# Patient Record
Sex: Male | Born: 1985 | Hispanic: Refuse to answer | Marital: Single | Smoking: Current every day smoker
Health system: Southern US, Community
[De-identification: ages and names within clinical notes are randomized; demographics above are authoritative.]

## PROBLEM LIST (undated history)

## (undated) DIAGNOSIS — F32A Depression, unspecified: Secondary | ICD-10-CM

## (undated) DIAGNOSIS — F329 Major depressive disorder, single episode, unspecified: Secondary | ICD-10-CM

## (undated) HISTORY — DX: Major depressive disorder, single episode, unspecified: F32.9

## (undated) HISTORY — PX: CIRCUMCISION: SUR203

## (undated) HISTORY — DX: Depression, unspecified: F32.A

---
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
## 2009-11-23 ENCOUNTER — Emergency Department (HOSPITAL_BASED_OUTPATIENT_CLINIC_OR_DEPARTMENT_OTHER): Admission: EM | Admit: 2009-11-23 | Discharge: 2009-11-23 | Payer: Self-pay | Admitting: Emergency Medicine

## 2010-11-19 ENCOUNTER — Encounter: Payer: Self-pay | Admitting: *Deleted

## 2010-11-19 ENCOUNTER — Emergency Department (HOSPITAL_BASED_OUTPATIENT_CLINIC_OR_DEPARTMENT_OTHER)
Admission: EM | Admit: 2010-11-19 | Discharge: 2010-11-20 | Disposition: A | Discharge status: Home / Self Care | Payer: BC Managed Care – PPO | Attending: Emergency Medicine | Admitting: Emergency Medicine

## 2010-11-19 DIAGNOSIS — K5289 Other specified noninfective gastroenteritis and colitis: Secondary | ICD-10-CM | POA: Insufficient documentation

## 2010-11-19 DIAGNOSIS — R112 Nausea with vomiting, unspecified: Secondary | ICD-10-CM | POA: Insufficient documentation

## 2010-11-19 DIAGNOSIS — R1032 Left lower quadrant pain: Secondary | ICD-10-CM | POA: Insufficient documentation

## 2010-11-19 DIAGNOSIS — R197 Diarrhea, unspecified: Secondary | ICD-10-CM | POA: Insufficient documentation

## 2010-11-19 DIAGNOSIS — K529 Noninfective gastroenteritis and colitis, unspecified: Secondary | ICD-10-CM

## 2010-11-19 MED ORDER — SODIUM CHLORIDE 0.9 % IV BOLUS (SEPSIS): 2000.0000 mL | Freq: Once | INTRAVENOUS | Status: DC

## 2010-11-19 MED ORDER — METOCLOPRAMIDE HCL 5 MG/ML IJ SOLN
10.0000 mg | Freq: Once | INTRAMUSCULAR | Status: AC
  Administered 2010-11-20: 10 mg via INTRAVENOUS
  Filled 2010-11-19: qty 2

## 2010-11-19 NOTE — ED Notes (Signed)
Pt states he has had N/V/D since 0700 today. "Feel weak"  Denies other s/s.

## 2010-11-19 NOTE — ED Provider Notes (Signed)
Scribed for Shawn Seamen, MD, the patient was seen in room MH01/MH01 . This chart was scribed by Ellie Lunch.   CSN: [id number] Arrival date & time: 11/19/2010 11:23 PM   First MD Initiated Contact with Patient 11/19/10 2342      Chief Complaint  Patient presents with  . Abdominal Pain    (Consider location/radiation/quality/duration/timing/severity/associated sxs/prior treatment) Patient is a 25 y.o. male presenting with abdominal pain. The history is provided by the patient. No language interpreter was used.  Abdominal Pain The primary symptoms of the illness include abdominal pain, nausea, vomiting and diarrhea. The primary symptoms of the illness do not include fever. The current episode started 13 to 24 hours ago. The onset of the illness was gradual. The problem has been gradually worsening.  The abdominal pain is located in the LLQ and RLQ. The abdominal pain does not radiate. The severity of the abdominal pain is 8/10. The abdominal pain is relieved by nothing.   Pt seen at 11:43 PM Shawn Meyer is a 25 y.o. male who presents to the Emergency Department complaining of lower abdominal pain. Pain started in am and became progressively worse throughout the day. Pain described as a sharp, cramping pain. Pt reports associated n/v/d and weakness. No fever, HA, dry mouth.    History reviewed. No pertinent past medical history.  Past Surgical History  Procedure Date  . Circumcision     History reviewed. No pertinent family history.  History  Substance Use Topics  . Smoking status: Current Everyday Smoker  . Smokeless tobacco: Not on file  . Alcohol Use: No    Review of Systems  Constitutional: Negative for fever.  Gastrointestinal: Positive for nausea, vomiting, abdominal pain and diarrhea.  Neurological: Positive for weakness.  All other systems reviewed and are negative.   Allergies  Peanut-containing drug products  Home Medications  No current outpatient  prescriptions on file.  BP 125/84  Pulse 84  Temp(Src) 98.3 F (36.8 C) (Oral)  Resp 20  Ht 6\' 4"  (1.93 m)  Wt 175 lb (79.379 kg)  BMI 21.30 kg/m2  SpO2 97%  Physical Exam  Nursing note and vitals reviewed. Constitutional: He is oriented to person, place, and time. He appears well-developed and well-nourished.  HENT:  Head: Normocephalic and atraumatic.  Eyes: EOM are normal. Pupils are equal, round, and reactive to light.  Neck: Normal range of motion.  Cardiovascular: Normal rate, regular rhythm, normal heart sounds and intact distal pulses.   Pulmonary/Chest: Effort normal and breath sounds normal. No respiratory distress.  Abdominal: Soft. He exhibits no distension. There is tenderness.       Mild tenderness in lower abdomen.   Musculoskeletal: Normal range of motion.  Neurological: He is alert and oriented to person, place, and time.  Skin: Skin is warm and dry.  Psychiatric: He has a normal mood and affect. Judgment normal.    ED Course  Procedures (including critical care time) OTHER DATA REVIEWED: Nursing notes, vital signs, and past medical records reviewed.   DIAGNOSTIC STUDIES: Oxygen Saturation is 97% on room air, normal by my interpretation.    11:45 PM EDP at PT bedside. Discussed diagnostic possibilities including viral infections. Plan to treat with fluids.   ED MEDICATIONS Medications  sodium chloride 0.9 % bolus 2,000 mL   metoCLOPramide (REGLAN) injection 10 mg      MDM   Nursing notes and vitals signs, including pulse oximetry, reviewed.  Summary of this visit's results, reviewed by myself:  Labs:  Results for orders placed during the hospital encounter of 11/19/10  URINALYSIS, ROUTINE W REFLEX MICROSCOPIC      Component Value Range   Color, Urine YELLOW  YELLOW    Appearance CLEAR  CLEAR    Specific Gravity, Urine 1.035 (*) 1.005 - 1.030    pH 6.0  5.0 - 8.0    Glucose, UA NEGATIVE  NEGATIVE (mg/dL)   Hgb urine dipstick NEGATIVE   NEGATIVE    Bilirubin Urine NEGATIVE  NEGATIVE    Ketones, ur NEGATIVE  NEGATIVE (mg/dL)   Protein, ur NEGATIVE  NEGATIVE (mg/dL)   Urobilinogen, UA 0.2  0.0 - 1.0 (mg/dL)   Nitrite NEGATIVE  NEGATIVE    Leukocytes, UA NEGATIVE  NEGATIVE       I personally performed the services described in this documentation, which was scribed in my presence.  The recorded information has been reviewed and considered.   2:13 AM Patient feels better after 2 L of normal saline. He is tracking fluids without emesis.     Shawn Seamen, MD 11/20/10 [phone]

## 2010-11-20 LAB — URINALYSIS, ROUTINE W REFLEX MICROSCOPIC
Bilirubin Urine: NEGATIVE
Glucose, UA: NEGATIVE mg/dL
Hgb urine dipstick: NEGATIVE
Ketones, ur: NEGATIVE mg/dL
Leukocytes, UA: NEGATIVE
Nitrite: NEGATIVE
Protein, ur: NEGATIVE mg/dL
Specific Gravity, Urine: 1.035 — ABNORMAL HIGH (ref 1.005–1.030)
Urobilinogen, UA: 0.2 mg/dL (ref 0.0–1.0)
pH: 6 (ref 5.0–8.0)

## 2010-11-20 MED ORDER — DIPHENOXYLATE-ATROPINE 2.5-0.025 MG PO TABS
2.0000 | ORAL_TABLET | Freq: Once | ORAL | Status: AC
  Administered 2010-11-20: 2 via ORAL
  Filled 2010-11-20: qty 2

## 2010-11-20 MED ORDER — DIPHENOXYLATE-ATROPINE 2.5-0.025 MG PO TABS: 2.0000 | ORAL_TABLET | Freq: Four times a day (QID) | ORAL | Status: AC | PRN

## 2010-11-20 MED ORDER — ONDANSETRON HCL 4 MG PO TABS: 4.0000 mg | ORAL_TABLET | Freq: Three times a day (TID) | ORAL | Status: AC | PRN

## 2012-05-27 ENCOUNTER — Emergency Department (HOSPITAL_COMMUNITY)
Admission: EM | Admit: 2012-05-27 | Discharge: 2012-05-27 | Disposition: A | Discharge status: Home / Self Care | Payer: BC Managed Care – PPO | Attending: Emergency Medicine | Admitting: Emergency Medicine

## 2012-05-27 ENCOUNTER — Encounter (HOSPITAL_COMMUNITY): Payer: Self-pay | Admitting: Emergency Medicine

## 2012-05-27 DIAGNOSIS — F17200 Nicotine dependence, unspecified, uncomplicated: Secondary | ICD-10-CM | POA: Insufficient documentation

## 2012-05-27 DIAGNOSIS — H1089 Other conjunctivitis: Secondary | ICD-10-CM | POA: Insufficient documentation

## 2012-05-27 MED ORDER — ERYTHROMYCIN 5 MG/GM OP OINT
TOPICAL_OINTMENT | OPHTHALMIC | Status: AC
  Filled 2012-05-27: qty 3.5

## 2012-05-27 MED ORDER — FLUORESCEIN SODIUM 1 MG OP STRP
ORAL_STRIP | OPHTHALMIC | Status: AC
  Filled 2012-05-27: qty 1

## 2012-05-27 MED ORDER — HYDROCODONE-ACETAMINOPHEN 5-325 MG PO TABS: 1.0000 | ORAL_TABLET | ORAL | Status: DC | PRN

## 2012-05-27 MED ORDER — TETRACAINE HCL 0.5 % OP SOLN
OPHTHALMIC | Status: AC
  Filled 2012-05-27: qty 2

## 2012-05-27 NOTE — ED Notes (Signed)
Bilateral eyes reddened and swollen.  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

## 2012-05-27 NOTE — ED Provider Notes (Addendum)
History     CSN: [id number]  Arrival date & time 05/27/12  0022   First MD Initiated Contact with Patient 05/27/12 0143      Chief Complaint  Patient presents with  . Eye Pain    (Consider location/radiation/quality/duration/timing/severity/associated sxs/prior treatment) HPI HPI Comments: Shawn Meyer is a 27 y.o. 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 He complains of burning and tearing. He is unable to keep his eyes open in the light.   History reviewed. No pertinent past medical history.  Past Surgical History  Procedure Laterality Date  . Circumcision      No family history on file.  History  Substance Use Topics  . Smoking status: Current Every Day Smoker  . Smokeless tobacco: Not on file  . Alcohol Use: No      Review of Systems  Constitutional: Negative for fever.       10 Systems reviewed and are negative for acute change except as noted in the HPI.  HENT: Negative for congestion.        Eye pain  Eyes: Negative for discharge and redness.  Respiratory: Negative for cough and shortness of breath.   Cardiovascular: Negative for chest pain.  Gastrointestinal: Negative for vomiting and abdominal pain.  Musculoskeletal: Negative for back pain.  Skin: Negative for rash.  Neurological: Negative for syncope, numbness and headaches.  Psychiatric/Behavioral:       No behavior change.    Allergies  Peanut-containing drug products  Home Medications   Current Outpatient Rx  Name  Route  Sig  Dispense  Refill  . HYDROcodone-acetaminophen (NORCO/VICODIN) 5-325 MG per tablet   Oral   Take 1 tablet by mouth every 4 (four) hours as needed for pain.   15 tablet   0     BP 154/90  Pulse 75  Temp(Src) 98 F (36.7 C) (Oral)  Resp 20  Ht 6\' 4"  (1.93 m)  Wt 180 lb (81.647 kg)  BMI 21.92 kg/m2  SpO2 98%  Physical Exam  Nursing note and vitals reviewed. Constitutional: He  appears well-developed and well-nourished.  Awake, alert, nontoxic appearance.  HENT:  Head: Normocephalic and atraumatic.  Eyes: EOM are normal. Pupils are equal, round, and reactive to light.  Injected conjunctiva bilaterally, tearing  Vision: R 20/20, L 20/20  Neck: Normal range of motion. Neck supple.  Cardiovascular: Normal rate and intact distal pulses.   Pulmonary/Chest: Effort normal. He exhibits no tenderness.  Abdominal: Soft. Bowel sounds are normal. There is no tenderness. There is no rebound.  Musculoskeletal: Normal range of motion. He exhibits no tenderness.  Baseline ROM, no obvious new focal weakness.  Neurological:  Mental status and motor strength appears baseline for patient and situation.  Skin: Skin is warm. No rash noted.  Psychiatric: He has a normal mood and affect.    ED Course  Procedures (including critical care time) Examined eyes with opthalmoscope, fluorescein negative both eyes. Applied erythromycin ointment to both eyes.    1. 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 No corneal burns or abrasions. Conjunctivitis on exam. Placed erythromycin ointment in both eyes. Follow up with ophthalmologist. Pt stable in ED with no significant deterioration  in condition.The patient appears reasonably screened and/or stabilized for discharge and I doubt any other medical condition or other [facility] requiring further screening, evaluation, or treatment in the ED at this time prior to discharge.  MDM Reviewed: nursing note and vitals           Nicoletta Dress. Colon Branch, MD 05/27/12 1610  Nicoletta Dress. Colon Branch, MD 05/27/12 [phone]

## 2012-05-27 NOTE — ED Notes (Signed)
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

## 2012-05-27 NOTE — ED Notes (Signed)
Discharge instructions given and reviewed with patient.  Prescription given for Vicodin; effects and use explained.  Patient verbalized understanding to use antibiotic ointment 3x/day for the next 3 days, use pain medicine if needed and to follow up with Dr. Lita Mains for continued pain.  Patient ambulatory; discharged home in good condition.

## 2013-10-17 ENCOUNTER — Telehealth: Payer: Self-pay | Admitting: Family Medicine

## 2013-10-17 NOTE — Telephone Encounter (Signed)
appt given per mothers request

## 2013-10-20 ENCOUNTER — Encounter: Payer: Self-pay | Admitting: Nurse Practitioner

## 2013-10-20 ENCOUNTER — Encounter (INDEPENDENT_AMBULATORY_CARE_PROVIDER_SITE_OTHER): Payer: Self-pay

## 2013-10-20 ENCOUNTER — Ambulatory Visit: Payer: Self-pay | Admitting: Nurse Practitioner

## 2013-10-20 VITALS — BP 140/81 | HR 78 | Temp 97.6°F | Ht 76.0 in | Wt 169.8 lb

## 2013-10-20 DIAGNOSIS — F32A Depression, unspecified: Secondary | ICD-10-CM

## 2013-10-20 DIAGNOSIS — F329 Major depressive disorder, single episode, unspecified: Secondary | ICD-10-CM

## 2013-10-20 MED ORDER — CITALOPRAM HYDROBROMIDE 40 MG PO TABS: 40.0000 mg | ORAL_TABLET | Freq: Every day | ORAL | Status: DC

## 2013-10-20 NOTE — Patient Instructions (Signed)
Stress and Stress Management Stress is a normal reaction to life events. It is what you feel when life demands more than you are used to or more than you can handle. Some stress can be useful. For example, the stress reaction can help you catch the last bus of the day, study for a test, or meet a deadline at work. But stress that occurs too often or for too long can cause problems. It can affect your emotional health and interfere with relationships and normal daily activities. Too much stress can weaken your immune system and increase your risk for physical illness. If you already have a medical problem, stress can make it worse. CAUSES  All sorts of life events may cause stress. An event that causes stress for one person may not be stressful for another person. Major life events commonly cause stress. These may be positive or negative. Examples include losing your job, moving into a new home, getting married, having a baby, or losing a loved one. Less obvious life events may also cause stress, especially if they occur day after day or in combination. Examples include working long hours, driving in traffic, caring for children, being in debt, or being in a difficult relationship. SIGNS AND SYMPTOMS Stress may cause emotional symptoms including, the following:  Anxiety. This is feeling worried, afraid, on edge, overwhelmed, or out of control.  Anger. This is feeling irritated or impatient.  Depression. This is feeling sad, down, helpless, or guilty.  Difficulty focusing, remembering, or making decisions. Stress may cause physical symptoms, including the following:   Aches and pains. These may affect your head, neck, back, stomach, or other areas of your body.  Tight muscles or clenched jaw.  Low energy or trouble sleeping. Stress may cause unhealthy behaviors, including the following:   Eating to feel better (overeating) or skipping meals.  Sleeping too little, too much, or both.  Working  too much or putting off tasks (procrastination).  Smoking, drinking alcohol, or using drugs to feel better. DIAGNOSIS  Stress is diagnosed through an assessment by your health care provider. Your health care provider will ask questions about your symptoms and any stressful life events.Your health care provider will also ask about your medical history and may order blood tests or other tests. Certain medical conditions and medicine can cause physical symptoms similar to stress. Mental illness can cause emotional symptoms and unhealthy behaviors similar to stress. Your health care provider may refer you to a mental health professional for further evaluation.  TREATMENT  Stress management is the recommended treatment for stress.The goals of stress management are reducing stressful life events and coping with stress in healthy ways.  Techniques for reducing stressful life events include the following:  Stress identification. Self-monitor for stress and identify what causes stress for you. These skills may help you to avoid some stressful events.  Time management. Set your priorities, keep a calendar of events, and learn to say "no." These tools can help you avoid making too many commitments. Techniques for coping with stress include the following:  Rethinking the problem. Try to think realistically about stressful events rather than ignoring them or overreacting. Try to find the positives in a stressful situation rather than focusing on the negatives.  Exercise. Physical exercise can release both physical and emotional tension. The key is to find a form of exercise you enjoy and do it regularly.  Relaxation techniques. These relax the body and mind. Examples include yoga, meditation, tai chi, biofeedback, deep  breathing, progressive muscle relaxation, listening to music, being out in nature, journaling, and other hobbies. Again, the key is to find one or more that you enjoy and can do  regularly.  Healthy lifestyle. Eat a balanced diet, get plenty of sleep, and do not smoke. Avoid using alcohol or drugs to relax.  Strong support network. Spend time with family, friends, or other people you enjoy being around.Express your feelings and talk things over with someone you trust. Counseling or talktherapy with a mental health professional may be helpful if you are having difficulty managing stress on your own. Medicine is typically not recommended for the treatment of stress.Talk to your health care provider if you think you need medicine for symptoms of stress. HOME CARE INSTRUCTIONS  Keep all follow-up visits as directed by your health care provider.  Take all medicines as directed by your health care provider. SEEK MEDICAL CARE IF:  Your symptoms get worse or you start having new symptoms.  You feel overwhelmed by your problems and can no longer manage them on your own. SEEK IMMEDIATE MEDICAL CARE IF:  You feel like hurting yourself or someone else. Document Released: 06/21/2000 Document Revised: 05/12/2013 Document Reviewed: 08/20/2012 [employer] Patient Information 2015 [employer]. This information is not intended to replace advice given to you by your health care provider. Make sure you discuss any questions you have with your health care provider.  

## 2013-10-20 NOTE — Addendum Note (Signed)
Addended by: Bennie PieriniMARTIN, MARY-MARGARET on: 10/20/2013 03:25 PM   Modules accepted: Level of Service

## 2013-10-20 NOTE — Progress Notes (Signed)
   Subjective:    Patient ID: Shawn Meyer, male    DOB: [birth year], 28 y.o.   MRN: [id number]  HPI Patient in c/o stress- gets shaky and SOB- under a lot of stress- Cries a lot because he is worried about bills.    Review of Systems  Constitutional: Negative.   HENT: Negative.   Respiratory: Negative.   Cardiovascular: Negative.   Genitourinary: Negative.   Neurological: Negative.   All other systems reviewed and are negative.      Objective:   Physical Exam  Constitutional: He is oriented to person, place, and time. He appears well-developed and well-nourished.  Cardiovascular: Normal rate, regular rhythm and normal heart sounds.   Pulmonary/Chest: Effort normal and breath sounds normal.  Neurological: He is alert and oriented to person, place, and time.  Skin: Skin is warm and dry.  Psychiatric: His behavior is normal. Judgment and thought content normal.   BP 140/81  Pulse 78  Temp(Src) 97.6 F (36.4 C) (Oral)  Ht 6\' 4"  (1.93 m)  Wt 169 lb 12.8 oz (77.021 kg)  BMI 20.68 kg/m2  Says that he thinks he would be better off dead but has no intensions of killing himself.       Assessment & Plan:   1. Depression    Meds ordered this encounter  Medications  . citalopram (CELEXA) 40 MG tablet    Sig: Take 1 tablet (40 mg total) by mouth daily.    Dispense:  30 tablet    Refill:  3    Order Specific Question:  Supervising Provider    Answer:  Deborra MedinaMOORE, DONALD W [[id number]]   Stress management Needs to get out of house and do things RTO in 3 weeks  Mary-Margaret Daphine DeutscherMartin, FNP

## 2013-11-20 ENCOUNTER — Ambulatory Visit: Payer: Self-pay | Admitting: Nurse Practitioner

## 2015-07-02 ENCOUNTER — Emergency Department (HOSPITAL_COMMUNITY): Payer: No Typology Code available for payment source

## 2015-07-02 ENCOUNTER — Emergency Department (HOSPITAL_COMMUNITY)
Admission: EM | Admit: 2015-07-02 | Discharge: 2015-07-02 | Disposition: A | Discharge status: Home / Self Care | Payer: No Typology Code available for payment source | Attending: Emergency Medicine | Admitting: Emergency Medicine

## 2015-07-02 ENCOUNTER — Encounter (HOSPITAL_COMMUNITY): Payer: Self-pay | Admitting: Emergency Medicine

## 2015-07-02 DIAGNOSIS — S0990XA Unspecified injury of head, initial encounter: Secondary | ICD-10-CM | POA: Insufficient documentation

## 2015-07-02 DIAGNOSIS — M25571 Pain in right ankle and joints of right foot: Secondary | ICD-10-CM | POA: Diagnosis not present

## 2015-07-02 DIAGNOSIS — S301XXA Contusion of abdominal wall, initial encounter: Secondary | ICD-10-CM | POA: Diagnosis not present

## 2015-07-02 DIAGNOSIS — M25561 Pain in right knee: Secondary | ICD-10-CM | POA: Diagnosis not present

## 2015-07-02 DIAGNOSIS — Y999 Unspecified external cause status: Secondary | ICD-10-CM | POA: Diagnosis not present

## 2015-07-02 DIAGNOSIS — S70211A Abrasion, right hip, initial encounter: Secondary | ICD-10-CM | POA: Insufficient documentation

## 2015-07-02 DIAGNOSIS — Y9389 Activity, other specified: Secondary | ICD-10-CM | POA: Insufficient documentation

## 2015-07-02 DIAGNOSIS — Z791 Long term (current) use of non-steroidal anti-inflammatories (NSAID): Secondary | ICD-10-CM | POA: Insufficient documentation

## 2015-07-02 DIAGNOSIS — M25562 Pain in left knee: Secondary | ICD-10-CM | POA: Diagnosis not present

## 2015-07-02 DIAGNOSIS — S20212A Contusion of left front wall of thorax, initial encounter: Secondary | ICD-10-CM | POA: Insufficient documentation

## 2015-07-02 DIAGNOSIS — F17200 Nicotine dependence, unspecified, uncomplicated: Secondary | ICD-10-CM | POA: Insufficient documentation

## 2015-07-02 DIAGNOSIS — Y92410 Unspecified street and highway as the place of occurrence of the external cause: Secondary | ICD-10-CM | POA: Insufficient documentation

## 2015-07-02 DIAGNOSIS — S299XXA Unspecified injury of thorax, initial encounter: Secondary | ICD-10-CM | POA: Diagnosis present

## 2015-07-02 LAB — I-STAT CHEM 8, ED
BUN: 15 mg/dL (ref 6–20)
CREATININE: 0.9 mg/dL (ref 0.61–1.24)
Calcium, Ion: 1.21 mmol/L (ref 1.12–1.23)
Chloride: 102 mmol/L (ref 101–111)
Glucose, Bld: 98 mg/dL (ref 65–99)
HEMATOCRIT: 48 % (ref 39.0–52.0)
HEMOGLOBIN: 16.3 g/dL (ref 13.0–17.0)
POTASSIUM: 3.6 mmol/L (ref 3.5–5.1)
SODIUM: 140 mmol/L (ref 135–145)
TCO2: 26 mmol/L (ref 0–100)

## 2015-07-02 MED ORDER — NAPROXEN 250 MG PO TABS: 250.0000 mg | ORAL_TABLET | Freq: Two times a day (BID) | ORAL | Status: DC | PRN

## 2015-07-02 MED ORDER — HYDROCODONE-ACETAMINOPHEN 5-325 MG PO TABS: ORAL_TABLET | ORAL | Status: DC

## 2015-07-02 MED ORDER — MORPHINE SULFATE (PF) 4 MG/ML IV SOLN
4.0000 mg | INTRAVENOUS | Status: DC | PRN
  Administered 2015-07-02: 4 mg via INTRAVENOUS
  Filled 2015-07-02: qty 1

## 2015-07-02 MED ORDER — METHOCARBAMOL 500 MG PO TABS: 1000.0000 mg | ORAL_TABLET | Freq: Four times a day (QID) | ORAL | Status: DC | PRN

## 2015-07-02 MED ORDER — IOPAMIDOL (ISOVUE-300) INJECTION 61%
100.0000 mL | Freq: Once | INTRAVENOUS | Status: AC | PRN
  Administered 2015-07-02: 100 mL via INTRAVENOUS
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
## 2015-07-02 NOTE — Discharge Instructions (Signed)
Take the prescriptions as directed.  Apply moist heat or ice to the area(s) of discomfort, for 15 minutes at a time, several times per day for the next few days.  Do not fall asleep on a heating or ice pack.  Call your regular medical doctor today to schedule a follow up appointment in the next 3 days.  Return to the Emergency Department immediately if worsening. ° °
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
## 2015-07-02 NOTE — ED Notes (Signed)
Pt verbalized understanding of no driving and to use caution within 4 hours of taking pain meds due to meds cause drowsiness 

## 2016-01-25 ENCOUNTER — Ambulatory Visit (INDEPENDENT_AMBULATORY_CARE_PROVIDER_SITE_OTHER): Payer: Medicaid Other | Admitting: Physician Assistant

## 2016-01-25 ENCOUNTER — Encounter: Payer: Self-pay | Admitting: Physician Assistant

## 2016-01-25 VITALS — BP 130/88 | HR 69 | Temp 96.7°F | Ht 78.0 in | Wt 188.6 lb

## 2016-01-25 DIAGNOSIS — F3342 Major depressive disorder, recurrent, in full remission: Secondary | ICD-10-CM

## 2016-01-25 DIAGNOSIS — M545 Low back pain, unspecified: Secondary | ICD-10-CM

## 2016-01-25 DIAGNOSIS — J069 Acute upper respiratory infection, unspecified: Secondary | ICD-10-CM | POA: Diagnosis not present

## 2016-01-25 DIAGNOSIS — G8929 Other chronic pain: Secondary | ICD-10-CM | POA: Insufficient documentation

## 2016-01-25 MED ORDER — CITALOPRAM HYDROBROMIDE 40 MG PO TABS: 40.0000 mg | ORAL_TABLET | Freq: Every day | ORAL | 6 refills | Status: DC

## 2016-01-25 MED ORDER — METHOCARBAMOL 500 MG PO TABS: 1000.0000 mg | ORAL_TABLET | Freq: Four times a day (QID) | ORAL | 2 refills | Status: DC | PRN

## 2016-01-25 MED ORDER — AZITHROMYCIN 250 MG PO TABS: ORAL_TABLET | ORAL | 0 refills | Status: DC

## 2016-01-25 NOTE — Patient Instructions (Signed)
Back Exercises Introduction If you have pain in your back, do these exercises 2-3 times each day or as told by your doctor. When the pain goes away, do the exercises once each day, but repeat the steps more times for each exercise (do more repetitions). If you do not have pain in your back, do these exercises once each day or as told by your doctor. Exercises Single Knee to Chest  Do these steps 3-5 times in a row for each leg: 1. Lie on your back on a firm bed or the floor with your legs stretched out. 2. Bring one knee to your chest. 3. Hold your knee to your chest by grabbing your knee or thigh. 4. Pull on your knee until you feel a gentle stretch in your lower back. 5. Keep doing the stretch for 10-30 seconds. 6. Slowly let go of your leg and straighten it. Pelvic Tilt  Do these steps 5-10 times in a row: 1. Lie on your back on a firm bed or the floor with your legs stretched out. 2. Bend your knees so they point up to the ceiling. Your feet should be flat on the floor. 3. Tighten your lower belly (abdomen) muscles to press your lower back against the floor. This will make your tailbone point up to the ceiling instead of pointing down to your feet or the floor. 4. Stay in this position for 5-10 seconds while you gently tighten your muscles and breathe evenly. Cat-Cow  Do these steps until your lower back bends more easily: 1. Get on your hands and knees on a firm surface. Keep your hands under your shoulders, and keep your knees under your hips. You may put padding under your knees. 2. Let your head hang down, and make your tailbone point down to the floor so your lower back is round like the back of a cat. 3. Stay in this position for 5 seconds. 4. Slowly lift your head and make your tailbone point up to the ceiling so your back hangs low (sags) like the back of a cow. 5. Stay in this position for 5 seconds. Press-Ups  Do these steps 5-10 times in a row: 1. Lie on your belly  (face-down) on the floor. 2. Place your hands near your head, about shoulder-width apart. 3. While you keep your back relaxed and keep your hips on the floor, slowly straighten your arms to raise the top half of your body and lift your shoulders. Do not use your back muscles. To make yourself more comfortable, you may change where you place your hands. 4. Stay in this position for 5 seconds. 5. Slowly return to lying flat on the floor. Bridges  Do these steps 10 times in a row: 1. Lie on your back on a firm surface. 2. Bend your knees so they point up to the ceiling. Your feet should be flat on the floor. 3. Tighten your butt muscles and lift your butt off of the floor until your waist is almost as high as your knees. If you do not feel the muscles working in your butt and the back of your thighs, slide your feet 1-2 inches farther away from your butt. 4. Stay in this position for 3-5 seconds. 5. Slowly lower your butt to the floor, and let your butt muscles relax. If this exercise is too easy, try doing it with your arms crossed over your chest. Belly Crunches  Do these steps 5-10 times in a row: 1. Lie   on your back on a firm bed or the floor with your legs stretched out. 2. Bend your knees so they point up to the ceiling. Your feet should be flat on the floor. 3. Cross your arms over your chest. 4. Tip your chin a little bit toward your chest but do not bend your neck. 5. Tighten your belly muscles and slowly raise your chest just enough to lift your shoulder blades a tiny bit off of the floor. 6. Slowly lower your chest and your head to the floor. Back Lifts  Do these steps 5-10 times in a row: 1. Lie on your belly (face-down) with your arms at your sides, and rest your forehead on the floor. 2. Tighten the muscles in your legs and your butt. 3. Slowly lift your chest off of the floor while you keep your hips on the floor. Keep the back of your head in line with the curve in your back.  Look at the floor while you do this. 4. Stay in this position for 3-5 seconds. 5. Slowly lower your chest and your face to the floor. Contact a doctor if:  Your back pain gets a lot worse when you do an exercise.  Your back pain does not lessen 2 hours after you exercise. If you have any of these problems, stop doing the exercises. Do not do them again unless your doctor says it is okay. Get help right away if:  You have sudden, very bad back pain. If this happens, stop doing the exercises. Do not do them again unless your doctor says it is okay. This information is not intended to replace advice given to you by your health care provider. Make sure you discuss any questions you have with your health care provider. Document Released: 01/28/2010 Document Revised: 06/03/2015 Document Reviewed: 02/19/2014  2017 [employer]  

## 2016-01-27 NOTE — Progress Notes (Signed)
BP 130/88   Pulse 69   Temp (!) 96.7 F (35.9 C) (Oral)   Ht 6\' 6"  (1.981 m)   Wt 188 lb 9.6 oz (85.5 kg)   BMI 21.79 kg/m    Subjective:    Patient ID: Shawn Meyer, male    DOB: 02/13/1985, 31 y.o.   MRN: [id number]  HPI: Shawn Meyer is a 31 y.o. male presenting on 01/25/2016 for Sinusitis and Medication Refill (Patient is requesting a refill on citalopram )  Patient has been sick for several days. Denies any severe fever. Has had significant cough congestion. He also needs refill on medications as noted. He has not had any other significant setback.  Relevant past medical, surgical, family and social history reviewed and updated as indicated. Allergies and medications reviewed and updated.  Past Medical History:  Diagnosis Date  . Depression     Past Surgical History:  Procedure Laterality Date  . CIRCUMCISION      Review of Systems  Constitutional: Positive for fatigue. Negative for appetite change.  HENT: Positive for sinus pressure and sore throat.   Eyes: Negative.  Negative for pain and visual disturbance.  Respiratory: Positive for shortness of breath and wheezing. Negative for cough and chest tightness.   Cardiovascular: Negative.  Negative for chest pain, palpitations and leg swelling.  Gastrointestinal: Negative.  Negative for abdominal pain, diarrhea, nausea and vomiting.  Endocrine: Negative.   Genitourinary: Negative.   Musculoskeletal: Positive for back pain and myalgias.  Skin: Negative.  Negative for color change and rash.  Neurological: Positive for headaches. Negative for weakness and numbness.  Psychiatric/Behavioral: Negative.     Allergies as of 01/25/2016      Reactions   Peanut-containing Drug Products Anaphylaxis      Medication List       Accurate as of 01/25/16 11:59 PM. Always use your most recent med list.          azithromycin 250 MG tablet Commonly known as:  ZITHROMAX Z-PAK Take as directed   citalopram 40 MG  tablet Commonly known as:  CELEXA Take 1 tablet (40 mg total) by mouth daily.   methocarbamol 500 MG tablet Commonly known as:  ROBAXIN Take 2 tablets (1,000 mg total) by mouth 4 (four) times daily as needed for muscle spasms (muscle spasm/pain).          Objective:    BP 130/88   Pulse 69   Temp (!) 96.7 F (35.9 C) (Oral)   Ht 6\' 6"  (1.981 m)   Wt 188 lb 9.6 oz (85.5 kg)   BMI 21.79 kg/m   Allergies  Allergen Reactions  . Peanut-Containing Drug Products Anaphylaxis    Physical Exam  Constitutional: He is oriented to person, place, and time. He appears well-developed and well-nourished. He appears distressed.  HENT:  Head: Normocephalic and atraumatic.  Right Ear: Tympanic membrane normal. No drainage. No middle ear effusion.  Left Ear: Tympanic membrane normal. No drainage.  No middle ear effusion.  Nose: Mucosal edema and rhinorrhea present. Right sinus exhibits no maxillary sinus tenderness. Left sinus exhibits no maxillary sinus tenderness.  Mouth/Throat: Uvula is midline. Posterior oropharyngeal erythema present. No oropharyngeal exudate.  Eyes: Conjunctivae and EOM are normal. Pupils are equal, round, and reactive to light. Right eye exhibits no discharge. Left eye exhibits no discharge.  Neck: Normal range of motion.  Cardiovascular: Normal rate, regular rhythm and normal heart sounds.   Pulmonary/Chest: Effort normal and breath sounds normal. No respiratory  distress. He has no wheezes.  Abdominal: Soft.  Lymphadenopathy:    He has no cervical adenopathy.  Neurological: He is alert and oriented to person, place, and time.  Skin: Skin is warm and dry.  Psychiatric: He has a normal mood and affect. His behavior is normal.  Nursing note and vitals reviewed.   Results for orders placed or performed during the hospital encounter of 07/02/15  I-stat Chem 8, ED  Result Value Ref Range   Sodium 140 135 - 145 mmol/L   Potassium 3.6 3.5 - 5.1 mmol/L   Chloride 102  101 - 111 mmol/L   BUN 15 6 - 20 mg/dL   Creatinine, Ser 1.610.90 0.61 - 1.24 mg/dL   Glucose, Bld 98 65 - 99 mg/dL   Calcium, Ion 0.961.21 0.451.12 - 1.23 mmol/L   TCO2 26 0 - 100 mmol/L   Hemoglobin 16.3 13.0 - 17.0 g/dL   HCT 40.948.0 81.139.0 - 91.452.0 %      Assessment & Plan:   1. Acute upper respiratory infection - azithromycin (ZITHROMAX Z-PAK) 250 MG tablet; Take as directed  Dispense: 6 each; Refill: 0  2. Recurrent major depressive disorder, in full remission (HCC) - citalopram (CELEXA) 40 MG tablet; Take 1 tablet (40 mg total) by mouth daily.  Dispense: 30 tablet; Refill: 6  3. Chronic midline low back pain without sciatica - methocarbamol (ROBAXIN) 500 MG tablet; Take 2 tablets (1,000 mg total) by mouth 4 (four) times daily as needed for muscle spasms (muscle spasm/pain).  Dispense: 30 tablet; Refill: 2   Continue all other maintenance medications as listed above.  Follow up plan: Return if symptoms worsen or fail to improve.  No orders of the defined types were placed in this encounter.   Educational handout given for back exercsies  Remus LofflerAngel S. Rohan Juenger PA-C [facility] [address]  [place], [place] [address] [phone]   01/27/2016, 7:18 PM

## 2016-03-10 ENCOUNTER — Ambulatory Visit: Payer: Medicaid Other | Admitting: Family Medicine

## 2016-03-13 ENCOUNTER — Encounter: Payer: Self-pay | Admitting: Nurse Practitioner

## 2016-03-13 ENCOUNTER — Telehealth: Payer: Self-pay | Admitting: Nurse Practitioner

## 2016-05-21 ENCOUNTER — Other Ambulatory Visit: Payer: Self-pay | Admitting: Physician Assistant

## 2016-05-21 DIAGNOSIS — G8929 Other chronic pain: Secondary | ICD-10-CM

## 2016-05-21 DIAGNOSIS — M545 Low back pain: Principal | ICD-10-CM
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
## 2021-11-11 ENCOUNTER — Encounter: Payer: Medicaid Other | Admitting: Nurse Practitioner

## 2021-11-11 NOTE — Progress Notes (Unsigned)
   Subjective:    Patient ID: Shawn Meyer, male    DOB: 08/15/1985, 36 y.o.   MRN: [id number]  HPI    Review of Systems     Objective:   Physical Exam        Assessment & Plan:

## 2021-12-12 ENCOUNTER — Ambulatory Visit: Payer: Medicaid Other | Admitting: Nurse Practitioner

## 2021-12-12 ENCOUNTER — Encounter: Payer: Self-pay | Admitting: Nurse Practitioner

## 2021-12-12 VITALS — BP 139/82 | HR 80 | Temp 97.8°F | Resp 20 | Ht 78.0 in | Wt 190.0 lb

## 2021-12-12 DIAGNOSIS — F3342 Major depressive disorder, recurrent, in full remission: Secondary | ICD-10-CM | POA: Diagnosis not present

## 2021-12-12 MED ORDER — BUSPIRONE HCL 10 MG PO TABS: 10.0000 mg | ORAL_TABLET | Freq: Two times a day (BID) | ORAL | 0 refills | Status: DC | PRN

## 2021-12-12 MED ORDER — ESCITALOPRAM OXALATE 10 MG PO TABS: 10.0000 mg | ORAL_TABLET | Freq: Every day | ORAL | 5 refills | Status: DC

## 2021-12-12 NOTE — Patient Instructions (Signed)

## 2021-12-12 NOTE — Progress Notes (Signed)
Subjective:    Patient ID: Shawn Meyer, male    DOB: [birth year], 36 y.o.   MRN: [id number]   Chief Complaint: Establish Care   HPI Patient comes in today to establish care. He has had a long bout with depression and anxiety. Was on ciltalopram but has not had it in several weeks. He was changed to just buspar and that does not help as well. He feels very stressed.      12/12/2021    3:57 PM 01/25/2016   10:13 AM 10/20/2013    3:06 PM  Depression screen PHQ 2/9  Decreased Interest 2 1 1   Down, Depressed, Hopeless 2 1 3   PHQ - 2 Score 4 2 4   Altered sleeping 3 3 3   Tired, decreased energy 2 1 2   Change in appetite 0 2 3  Feeling bad or failure about yourself  0 1 3  Trouble concentrating 0 0 3  Moving slowly or fidgety/restless 2 0 1  Suicidal thoughts 0 0 1  PHQ-9 Score 11 9 20   Difficult doing work/chores Very difficult        12/12/2021    3:58 PM  GAD 7 : Generalized Anxiety Score  Nervous, Anxious, on Edge 3  Control/stop worrying 2  Worry too much - different things 2  Trouble relaxing 3  Restless 0  Easily annoyed or irritable 2  Afraid - awful might happen 0  Total GAD 7 Score 12  Anxiety Difficulty Very difficult      Review of Systems  Constitutional:  Negative for diaphoresis.  Eyes:  Negative for pain.  Respiratory:  Negative for shortness of breath.   Cardiovascular:  Negative for chest pain, palpitations and leg swelling.  Gastrointestinal:  Negative for abdominal pain.  Endocrine: Negative for polydipsia.  Skin:  Negative for rash.  Neurological:  Negative for dizziness, weakness and headaches.  Hematological:  Does not bruise/bleed easily.  All other systems reviewed and are negative.      Objective:   Physical Exam Constitutional:      Appearance: Normal appearance.  Cardiovascular:     Rate and Rhythm: Normal rate and regular rhythm.     Heart sounds: Normal heart sounds.  Pulmonary:     Effort: Pulmonary effort is normal.      Breath sounds: Normal breath sounds.  Skin:    General: Skin is warm.  Neurological:     General: No focal deficit present.     Mental Status: He is alert and oriented to person, place, and time.  Psychiatric:        Mood and Affect: Mood normal.        Behavior: Behavior normal.     BP 139/82   Pulse 80   Temp 97.8 F (36.6 C) (Temporal)   Resp 20   Ht 6\' 6"  (1.981 m)   Wt 190 lb (86.2 kg)   SpO2 95%   BMI 21.96 kg/m        Assessment & Plan:  Shawn Meyer in today with chief complaint of Establish Care   1. Recurrent major depressive disorder, in full remission ([place]) Stress management Gen sight swab done - escitalopram (LEXAPRO) 10 MG tablet; Take 1 tablet (10 mg total) by mouth daily.  Dispense: 30 tablet; Refill: 5 - busPIRone (BUSPAR) 10 MG tablet; Take 1 tablet (10 mg total) by mouth 2 (two) times daily as needed.  Dispense: 60 tablet; Refill: 0    The above assessment and management  plan was discussed with the patient. The patient verbalized understanding of and has agreed to the management plan. Patient is aware to call the clinic if symptoms persist or worsen. Patient is aware when to return to the clinic for a follow-up visit. Patient educated on when it is appropriate to go to the emergency department.   Mary-Margaret Daphine Deutscher, FNP

## 2022-01-10 ENCOUNTER — Ambulatory Visit: Payer: Medicaid Other | Admitting: Nurse Practitioner

## 2022-01-11 ENCOUNTER — Encounter: Payer: Self-pay | Admitting: Nurse Practitioner

## 2022-01-20 ENCOUNTER — Ambulatory Visit: Payer: Medicaid Other | Admitting: Nurse Practitioner

## 2022-01-20 ENCOUNTER — Encounter: Payer: Self-pay | Admitting: Nurse Practitioner

## 2022-01-20 VITALS — BP 132/84 | HR 80 | Temp 98.1°F | Resp 20 | Ht 78.0 in | Wt 188.0 lb

## 2022-01-20 DIAGNOSIS — F3342 Major depressive disorder, recurrent, in full remission: Secondary | ICD-10-CM | POA: Diagnosis not present

## 2022-01-20 DIAGNOSIS — F5101 Primary insomnia: Secondary | ICD-10-CM | POA: Diagnosis not present

## 2022-01-20 MED ORDER — ESCITALOPRAM OXALATE 20 MG PO TABS: 20.0000 mg | ORAL_TABLET | Freq: Every day | ORAL | 1 refills | Status: AC

## 2022-01-20 MED ORDER — TRAZODONE HCL 50 MG PO TABS: 25.0000 mg | ORAL_TABLET | Freq: Every evening | ORAL | 1 refills | Status: AC | PRN

## 2022-01-20 MED ORDER — BUSPIRONE HCL 10 MG PO TABS: 10.0000 mg | ORAL_TABLET | Freq: Two times a day (BID) | ORAL | 5 refills | Status: AC | PRN

## 2022-01-20 NOTE — Patient Instructions (Signed)
Insomnia Insomnia is a sleep disorder that makes it difficult to fall asleep or stay asleep. Insomnia can cause fatigue, low energy, difficulty concentrating, mood swings, and poor performance at work or school. There are three different ways to classify insomnia: Difficulty falling asleep. Difficulty staying asleep. Waking up too early in the morning. Any type of insomnia can be long-term (chronic) or short-term (acute). Both are common. Short-term insomnia usually lasts for 3 months or less. Chronic insomnia occurs at least three times a week for longer than 3 months. What are the causes? Insomnia may be caused by another condition, situation, or substance, such as: Having certain mental health conditions, such as anxiety and depression. Using caffeine, alcohol, tobacco, or drugs. Having gastrointestinal conditions, such as gastroesophageal reflux disease (GERD). Having certain medical conditions. These include: Asthma. Alzheimer's disease. Stroke. Chronic pain. An overactive thyroid gland (hyperthyroidism). Other sleep disorders, such as restless legs syndrome and sleep apnea. Menopause. Sometimes, the cause of insomnia may not be known. What increases the risk? Risk factors for insomnia include: Gender. Females are affected more often than males. Age. Insomnia is more common as people get older. Stress and certain medical and mental health conditions. Lack of exercise. Having an irregular work schedule. This may include working night shifts and traveling between different time zones. What are the signs or symptoms? If you have insomnia, the main symptom is having trouble falling asleep or having trouble staying asleep. This may lead to other symptoms, such as: Feeling tired or having low energy. Feeling nervous about going to sleep. Not feeling rested in the morning. Having trouble concentrating. Feeling irritable, anxious, or depressed. How is this diagnosed? This condition  may be diagnosed based on: Your symptoms and medical history. Your health care provider may ask about: Your sleep habits. Any medical conditions you have. Your mental health. A physical exam. How is this treated? Treatment for insomnia depends on the cause. Treatment may focus on treating an underlying condition that is causing the insomnia. Treatment may also include: Medicines to help you sleep. Counseling or therapy. Lifestyle adjustments to help you sleep better. Follow these instructions at home: Eating and drinking  Limit or avoid alcohol, caffeinated beverages, and products that contain nicotine and tobacco, especially close to bedtime. These can disrupt your sleep. Do not eat a large meal or eat spicy foods right before bedtime. This can lead to digestive discomfort that can make it hard for you to sleep. Sleep habits  Keep a sleep diary to help you and your health care provider figure out what could be causing your insomnia. Write down: When you sleep. When you wake up during the night. How well you sleep and how rested you feel the next day. Any side effects of medicines you are taking. What you eat and drink. Make your bedroom a dark, comfortable place where it is easy to fall asleep. Put up shades or blackout curtains to block light from outside. Use a white noise machine to block noise. Keep the temperature cool. Limit screen use before bedtime. This includes: Not watching TV. Not using your smartphone, tablet, or computer. Stick to a routine that includes going to bed and waking up at the same times every day and night. This can help you fall asleep faster. Consider making a quiet activity, such as reading, part of your nighttime routine. Try to avoid taking naps during the day so that you sleep better at night. Get out of bed if you are still awake after   15 minutes of trying to sleep. Keep the lights down, but try reading or doing a quiet activity. When you feel  sleepy, go back to bed. General instructions Take over-the-counter and prescription medicines only as told by your health care provider. Exercise regularly as told by your health care provider. However, avoid exercising in the hours right before bedtime. Use relaxation techniques to manage stress. Ask your health care provider to suggest some techniques that may work well for you. These may include: Breathing exercises. Routines to release muscle tension. Visualizing peaceful scenes. Make sure that you drive carefully. Do not drive if you feel very sleepy. Keep all follow-up visits. This is important. Contact a health care provider if: You are tired throughout the day. You have trouble in your daily routine due to sleepiness. You continue to have sleep problems, or your sleep problems get worse. Get help right away if: You have thoughts about hurting yourself or someone else. Get help right away if you feel like you may hurt yourself or others, or have thoughts about taking your own life. Go to your nearest emergency room or: Call 911. Call the National Suicide Prevention Lifeline at 1-800-273-8255 or 988. This is open 24 hours a day. Text the Crisis Text Line at 741741. Summary Insomnia is a sleep disorder that makes it difficult to fall asleep or stay asleep. Insomnia can be long-term (chronic) or short-term (acute). Treatment for insomnia depends on the cause. Treatment may focus on treating an underlying condition that is causing the insomnia. Keep a sleep diary to help you and your health care provider figure out what could be causing your insomnia. This information is not intended to replace advice given to you by your health care provider. Make sure you discuss any questions you have with your health care provider. Document Revised: 12/06/2020 Document Reviewed: 12/06/2020 Elsevier Patient Education  2023 [employer]vier Inc.  

## 2022-01-20 NOTE — Progress Notes (Signed)
Subjective:    Patient ID: Shawn Meyer, male    DOB: [birth year], 37 y.o.   MRN: [id number]   Chief Complaint: No chief complaint on file.   HPI Patent was seen on 12/12/21 with major depression. He was accompanied by his mom that day. She said he had been depressed since his brother died several year ago. We started him on lexapro and buspar. We also did a gene sight test on him he is here today for follow up and to review gene sight results. Since starting him on lexapro and buspar he is doing better. He takes lexapro and buspar every morning and buspar at night. He says he feels having more energy and more inclined to get up and do things. He works at [employer].    01/20/2022    4:01 PM 12/12/2021    3:58 PM  GAD 7 : Generalized Anxiety Score  Nervous, Anxious, on Edge 0 3  Control/stop worrying 0 2  Worry too much - different things 0 2  Trouble relaxing 0 3  Restless 0 0  Easily annoyed or irritable 0 2  Afraid - awful might happen 0 0  Total GAD 7 Score 0 12  Anxiety Difficulty Not difficult at all Very difficult        01/20/2022    4:01 PM 12/12/2021    3:57 PM 01/25/2016   10:13 AM  Depression screen PHQ 2/9  Decreased Interest 1 2 1   Down, Depressed, Hopeless 1 2 1   PHQ - 2 Score 2 4 2   Altered sleeping 2 3 3   Tired, decreased energy 1 2 1   Change in appetite 0 0 2  Feeling bad or failure about yourself  0 0 1  Trouble concentrating 0 0 0  Moving slowly or fidgety/restless 0 2 0  Suicidal thoughts 0 0 0  PHQ-9 Score 5 11 9   Difficult doing work/chores Not difficult at all Very difficult     He use to be on trazadone to sleep but has ran out of his meds     Review of Systems  Constitutional:  Negative for diaphoresis.  Eyes:  Negative for pain.  Respiratory:  Negative for shortness of breath.   Cardiovascular:  Negative for chest pain, palpitations and leg swelling.  Gastrointestinal:  Negative for abdominal pain.  Endocrine: Negative for  polydipsia.  Skin:  Negative for rash.  Neurological:  Negative for dizziness, weakness and headaches.  Hematological:  Does not bruise/bleed easily.  All other systems reviewed and are negative.      Objective:   Physical Exam Vitals reviewed.  Constitutional:      Appearance: Normal appearance.  Cardiovascular:     Rate and Rhythm: Normal rate. Rhythm irregular.     Heart sounds: Normal heart sounds.  Pulmonary:     Effort: Pulmonary effort is normal.     Breath sounds: Normal breath sounds.  Skin:    General: Skin is warm.  Neurological:     General: No focal deficit present.     Mental Status: He is alert.  Psychiatric:        Mood and Affect: Mood normal.    BP 132/84   Pulse 80   Temp 98.1 F (36.7 C) (Temporal)   Resp 20   Ht 6\' 6"  (1.981 m)   Wt 188 lb (85.3 kg)   SpO2 99%   BMI 21.73 kg/m         Assessment & Plan:  Shawn Meyer  Shawn Meyer in today with chief complaint of Recheck anxiety   1. Recurrent major depressive disorder, in full remission ([place]) Increased lexapro to 20mg  daily Reviewed gene sight results with patient Follow  up in 6 months unless needed. - escitalopram (LEXAPRO) 20 MG tablet; Take 1 tablet (20 mg total) by mouth daily.  Dispense: 90 tablet; Refill: 1 - busPIRone (BUSPAR) 10 MG tablet; Take 1 tablet (10 mg total) by mouth 2 (two) times daily as needed.  Dispense: 60 tablet; Refill: 5  2. Primary insomnia Bedtime routine - traZODone (DESYREL) 50 MG tablet; Take 0.5-1 tablets (25-50 mg total) by mouth at bedtime as needed for sleep.  Dispense: 90 tablet; Refill: 1    The above assessment and management plan was discussed with the patient. The patient verbalized understanding of and has agreed to the management plan. Patient is aware to call the clinic if symptoms persist or worsen. Patient is aware when to return to the clinic for a follow-up visit. Patient educated on when it is appropriate to go to the emergency department.    Mary-Margaret Hassell Done, FNP

## 2022-01-25 ENCOUNTER — Encounter: Payer: Self-pay | Admitting: Nurse Practitioner

## 2022-03-14 ENCOUNTER — Ambulatory Visit: Payer: Medicaid Other | Admitting: Family Medicine

## 2022-10-12 ENCOUNTER — Ambulatory Visit: Payer: Medicaid Other | Admitting: Nurse Practitioner

## 2022-10-16 ENCOUNTER — Encounter: Payer: Self-pay | Admitting: Nurse Practitioner

## 2023-04-20 ENCOUNTER — Ambulatory Visit: Payer: MEDICAID | Admitting: Nurse Practitioner

## 2023-04-20 ENCOUNTER — Encounter: Payer: Self-pay | Admitting: Nurse Practitioner

## 2023-04-20 VITALS — BP 150/87 | HR 56 | Temp 98.1°F | Ht 78.0 in | Wt 205.0 lb

## 2023-04-20 DIAGNOSIS — I10 Essential (primary) hypertension: Secondary | ICD-10-CM | POA: Insufficient documentation

## 2023-04-20 DIAGNOSIS — J029 Acute pharyngitis, unspecified: Secondary | ICD-10-CM

## 2023-04-20 LAB — RAPID STREP SCREEN (MED CTR MEBANE ONLY): Strep Gp A Ag, IA W/Reflex: NEGATIVE

## 2023-04-20 LAB — CULTURE, GROUP A STREP

## 2023-04-20 MED ORDER — AMOXICILLIN 875 MG PO TABS: 875.0000 mg | ORAL_TABLET | Freq: Two times a day (BID) | ORAL | 0 refills | Status: AC

## 2023-04-20 MED ORDER — LISINOPRIL 20 MG PO TABS: 20.0000 mg | ORAL_TABLET | Freq: Every day | ORAL | 3 refills | Status: AC

## 2023-04-20 NOTE — Progress Notes (Signed)
   Subjective:    Patient ID: Truddie Hidden, male    DOB: 1985/11/24, 38 y.o.   MRN: [id number]   Chief Complaint: Sore Throat (White patches in throat/)   Sore Throat  This is a new problem. The current episode started in the past 7 days. The problem has been gradually worsening. Neither side of throat is experiencing more pain than the other. There has been no fever. The pain is at a severity of 8/10. The pain is moderate. Associated symptoms include congestion, swollen glands and trouble swallowing. He has had exposure to strep. Treatments tried: amoxicillin for 2 days. The treatment provided mild relief.    Patient Active Problem List   Diagnosis Date Noted   Recurrent major depressive disorder, in full remission (HCC) 01/25/2016   Chronic midline low back pain without sciatica 01/25/2016  '    Review of Systems  Constitutional:  Negative for chills and fever.  HENT:  Positive for congestion and trouble swallowing.        Objective:   Physical Exam Constitutional:      Appearance: Normal appearance.  HENT:     Right Ear: Tympanic membrane normal.     Nose: Congestion present. No rhinorrhea.     Mouth/Throat:     Pharynx: Posterior oropharyngeal erythema present. No oropharyngeal exudate.  Cardiovascular:     Rate and Rhythm: Normal rate and regular rhythm.     Heart sounds: Normal heart sounds.  Pulmonary:     Effort: Pulmonary effort is normal.     Breath sounds: Normal breath sounds.  Musculoskeletal:     Cervical back: Normal range of motion and neck supple.  Skin:    General: Skin is warm.  Neurological:     General: No focal deficit present.     Mental Status: He is alert and oriented to person, place, and time.  Psychiatric:        Mood and Affect: Mood normal.        Behavior: Behavior normal.     BP (!) 150/87   Pulse (!) 56   Temp 98.1 F (36.7 C) (Temporal)   Ht 6\' 6"  (1.981 m)   Wt 205 lb (93 kg)   BMI 23.69 kg/m   Strep negative      Assessment & Plan:   Truddie Hidden in today with chief complaint of Sore Throat (White patches in throat/)   1. Sore throat (Primary) Take medication as prescribe Cotton underwear Take shower not bath Cranberry juice, yogurt Force fluids AZO over the counter X2 days Culture pending RTO prn  - Rapid Strep Screen ([facility] ONLY) - amoxicillin (AMOXIL) 875 MG tablet; Take 1 tablet (875 mg total) by mouth 2 (two) times daily. 1 po BID  Dispense: 20 tablet; Refill: 0  2. Primary hypertension Low sodium Absolutely refuses lab work- agreed to do at next visist - lisinopril (ZESTRIL) 20 MG tablet; Take 1 tablet (20 mg total) by mouth daily.  Dispense: 90 tablet; Refill: 3    The above assessment and management plan was discussed with the patient. The patient verbalized understanding of and has agreed to the management plan. Patient is aware to call the clinic if symptoms persist or worsen. Patient is aware when to return to the clinic for a follow-up visit. Patient educated on when it is appropriate to go to the emergency department.   Mary-Margaret Daphine Deutscher, FNP

## 2023-04-20 NOTE — Patient Instructions (Signed)
Force fluids °Motrin or tylenol OTC °OTC decongestant °Throat lozenges if help °New toothbrush in 3 days ° °

## 2023-05-21 ENCOUNTER — Encounter: Payer: MEDICAID | Admitting: Nurse Practitioner

## 2023-05-22 ENCOUNTER — Encounter: Payer: Self-pay | Admitting: Nurse Practitioner

## 2023-10-04 ENCOUNTER — Ambulatory Visit: Payer: MEDICAID | Admitting: Family Medicine

## 2023-10-04 ENCOUNTER — Encounter: Payer: Self-pay | Admitting: Nurse Practitioner
